# Patient Record
Sex: Male | Born: 1990 | Race: White | Hispanic: No | Marital: Single | Smoking: Never smoker
Health system: Southern US, Community
[De-identification: ages and names within clinical notes are randomized; demographics above are authoritative.]

## PROBLEM LIST (undated history)

## (undated) DIAGNOSIS — N200 Calculus of kidney: Secondary | ICD-10-CM

## (undated) HISTORY — PX: TESTICULAR PROSTHESIS INSERTION: SUR717

## (undated) HISTORY — PX: HERNIA REPAIR: SHX51

---
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
## 2010-08-21 ENCOUNTER — Emergency Department (HOSPITAL_COMMUNITY)
Admission: EM | Admit: 2010-08-21 | Discharge: 2010-08-21 | Disposition: A | Discharge status: Home / Self Care | Payer: 59 | Attending: Emergency Medicine | Admitting: Emergency Medicine

## 2010-08-21 ENCOUNTER — Emergency Department (HOSPITAL_COMMUNITY): Payer: 59

## 2010-08-21 DIAGNOSIS — R319 Hematuria, unspecified: Secondary | ICD-10-CM | POA: Insufficient documentation

## 2010-08-21 DIAGNOSIS — M549 Dorsalgia, unspecified: Secondary | ICD-10-CM | POA: Insufficient documentation

## 2010-08-21 DIAGNOSIS — R10819 Abdominal tenderness, unspecified site: Secondary | ICD-10-CM | POA: Insufficient documentation

## 2010-08-21 DIAGNOSIS — R109 Unspecified abdominal pain: Secondary | ICD-10-CM | POA: Insufficient documentation

## 2010-08-21 LAB — DIFFERENTIAL
Basophils Absolute: 0.1 10*3/uL (ref 0.0–0.1)
Basophils Relative: 1 % (ref 0–1)
Eosinophils Absolute: 0.7 10*3/uL (ref 0.0–0.7)
Eosinophils Relative: 7 % — ABNORMAL HIGH (ref 0–5)
Lymphocytes Relative: 40 % (ref 12–46)
Lymphs Abs: 3.8 10*3/uL (ref 0.7–4.0)
Monocytes Absolute: 0.7 10*3/uL (ref 0.1–1.0)
Monocytes Relative: 7 % (ref 3–12)
Neutro Abs: 4.2 10*3/uL (ref 1.7–7.7)
Neutrophils Relative %: 45 % (ref 43–77)

## 2010-08-21 LAB — BASIC METABOLIC PANEL
BUN: 12 mg/dL (ref 6–23)
CO2: 27 mEq/L (ref 19–32)
Calcium: 9.2 mg/dL (ref 8.4–10.5)
Chloride: 101 mEq/L (ref 96–112)
Creatinine, Ser: 0.95 mg/dL (ref 0.4–1.5)
GFR calc Af Amer: >60 mL/min (ref >60)
GFR calc non Af Amer: >60 mL/min (ref >60)
Glucose, Bld: 96 mg/dL (ref 70–99)
Potassium: 3.9 mEq/L (ref 3.5–5.1)
Sodium: 136 mEq/L (ref 135–145)

## 2010-08-21 LAB — URINE MICROSCOPIC-ADD ON

## 2010-08-21 LAB — CBC
HCT: 43.9 % (ref 39.0–52.0)
Hemoglobin: 15.1 g/dL (ref 13.0–17.0)
MCH: 30.8 pg (ref 26.0–34.0)
MCHC: 34.4 g/dL (ref 30.0–36.0)
MCV: 89.6 fL (ref 78.0–100.0)
Platelets: 244 10*3/uL (ref 150–400)
RBC: 4.9 MIL/uL (ref 4.22–5.81)
RDW: 13.3 % (ref 11.5–15.5)
WBC: 9.4 10*3/uL (ref 4.0–10.5)

## 2010-08-21 LAB — URINALYSIS, ROUTINE W REFLEX MICROSCOPIC
Bilirubin Urine: NEGATIVE
Ketones, ur: NEGATIVE mg/dL
Leukocytes, UA: NEGATIVE
Nitrite: NEGATIVE
Protein, ur: NEGATIVE mg/dL
Specific Gravity, Urine: 1.011 (ref 1.005–1.030)
Urine Glucose, Fasting: NEGATIVE mg/dL
Urobilinogen, UA: 0.2 mg/dL (ref 0.0–1.0)
pH: 7 (ref 5.0–8.0)

## 2010-11-22 NOTE — Op Note (Signed)
[name], [name]              ACCOUNT NO.:  [id number]   MEDICAL RECORD NO.:  [id number]          PATIENT TYPE:  AMB   LOCATION:  [facility]                         FACILITY:  [facility]   PHYSICIAN:  Heloise Purpura, MD      DATE OF BIRTH:  02-21-1991   DATE OF PROCEDURE:  06/27/2007  DATE OF DISCHARGE:                               OPERATIVE REPORT   SURGEON:  Heloise Purpura, M.D.   ASSISTANT:  Dr. Rachel Bo.   PROCEDURE:  1. Left inguinal exploration.  2. Diagnostic laparoscopy.  3. Insertion of left testicular prosthesis.   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  In the past, he  has had surgery involving a right hydrocele and is unsure of the details  regarding the disposition of his left testicle.  He presented to the  clinic and also desired a left testicular prosthesis.  He presents today  to the operating room electively for exploration and placement of left  testicular prosthesis.   DESCRIPTION OF PROCEDURE:  The patient was brought back into the  operating room.  He was placed supine on the operating table.  A  preoperative timeout was performed and he received preoperative  antibiotics.  An LMA anesthetic was initiated and then he was prepped  and draped in the usual sterile fashion.  The external ring was palpated  manually on the left side.  A subinguinal incision was made over this  along the skin folds.  The incision was approximately 4 cm in length.  We then used cautery to dissect through Scarpa's fascia and used [employer] retractors to expose the external ring.  We identified what  appeared to be an atrophic cord structure.  We were able to get a right  angle around it and dissecting through this we were unable to identify  any true testicular vessels due to either atrophy of vessels that were  present or scarring from the patient's prior surgery. No testis was  palpable within the  inguinal canal.  At this point the decision was made  to proceed with diagnostic laparoscopy to ensure that there were in fact  vessels into the inguinal canal which would indicate an absent left  testicle.   A small 1 cm incision was made through the lower aspect of the  umbilicus.  Under direct vision we dissected down through the rectus  fascia, through the rectus to the peritoneum.  A 5 mm laparoscopic port  was inserted under direct vision and positioning was verified with flow  patterns from the insufflator as well as with direct vision using the  camera port.  The camera was then used to identify vessels as well as a  vas deferens at the internal inguinal ring on the right side which was  the side of his testicle.  On the left side, we could see testicular  vessels going into the canal as well as what appeared to be an  atrophic/atretic vas deferens.  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 The  pneumoperitoneum was then expelled and the 5 mm trocar was removed.   We then proceeded with left testicular prosthesis placement.  Through  the inguinal incision, we found the dependent portion of the scrotum  after a space was created by using a sponge stick to spread and create a  scrotal pouch. We placed a 3-0 PDS suture in a figure-of-eight fashion  at this position on the inside of the scrotum through the dartos fascia  with care not to button hole the scrotal skin.  We then inflated a large  testicular prosthesis with 20 mL of sterile saline after all air had  been removed from the prosthesis.  We then attached the prosthesis tot  the preplaced PDS suture to secure it to the dependent portion of the  left hemiscrotum.  It was tied down.  Its position was adequate once the  prosthesis had  been redelivered into the scrotum.  At this point we used  copious antibiotic irrigation to irrigate all wounds, placed local  anesthetic with 0.5% marcaine, and closed the wounds.  The umbilical  wound was closed in a figure-of-eight fashion closing the fascia with a  0-vicryl and the skin was reapproximated with 4-0 Monocryl subcuticular  sutures.  The inguinal incision was closed in layers with 3-0 Vicryl to  reaproximate Scarpa's fascia and 4-0 Monocryl subcuticular closure for  the skin.  At this point we ended the procedure. Dermabond was placed  over each incision site and a fluff scrotal compressive dressing was  also placed.  Please note Dr. Laverle Patter was the primary surgeon and was  present throughout the entirety of this case.   ESTIMATED BLOOD LOSS:  Minimal.   URINE OUTPUT:  Not recorded.   DRAINS:  None.   SPECIMENS:  None.   DISPOSITION:  The patient will go to PACU for further care.     ______________________________  Dr. Merilynn Finland, MD  Electronically Signed    Hadley Pen  D:  06/27/2007  T:  06/27/2007  Job:  [id number]

## 2010-12-09 ENCOUNTER — Other Ambulatory Visit: Payer: Self-pay | Admitting: Internal Medicine

## 2010-12-09 DIAGNOSIS — N63 Unspecified lump in unspecified breast: Secondary | ICD-10-CM

## 2010-12-15 ENCOUNTER — Other Ambulatory Visit: Payer: Self-pay | Admitting: Internal Medicine

## 2010-12-15 ENCOUNTER — Ambulatory Visit
Admission: RE | Admit: 2010-12-15 | Discharge: 2010-12-15 | Disposition: A | Discharge status: Home / Self Care | Payer: 59 | Source: Ambulatory Visit | Attending: Internal Medicine | Admitting: Internal Medicine

## 2010-12-15 DIAGNOSIS — N63 Unspecified lump in unspecified breast: Secondary | ICD-10-CM

## 2010-12-16 ENCOUNTER — Other Ambulatory Visit: Payer: Self-pay | Admitting: Internal Medicine

## 2010-12-16 ENCOUNTER — Ambulatory Visit
Admission: RE | Admit: 2010-12-16 | Discharge: 2010-12-16 | Disposition: A | Discharge status: Home / Self Care | Payer: 59 | Source: Ambulatory Visit | Attending: Internal Medicine | Admitting: Internal Medicine

## 2010-12-16 ENCOUNTER — Ambulatory Visit: Admission: RE | Admit: 2010-12-16 | Payer: 59 | Source: Ambulatory Visit

## 2010-12-16 ENCOUNTER — Ambulatory Visit
Admission: RE | Admit: 2010-12-16 | Discharge: 2010-12-16 | Disposition: A | Discharge status: Home / Self Care | Payer: 59 | Source: Ambulatory Visit

## 2010-12-16 DIAGNOSIS — N63 Unspecified lump in unspecified breast: Secondary | ICD-10-CM

## 2011-04-14 LAB — POCT HEMOGLOBIN-HEMACUE
Hemoglobin: 16.2 — ABNORMAL HIGH
Operator id: 268271

## 2014-08-29 ENCOUNTER — Encounter (HOSPITAL_COMMUNITY): Payer: Self-pay | Admitting: Family Medicine

## 2014-08-29 ENCOUNTER — Emergency Department (HOSPITAL_COMMUNITY)
Admission: EM | Admit: 2014-08-29 | Discharge: 2014-08-29 | Disposition: A | Discharge status: Home / Self Care | Payer: Medicare Other | Attending: Emergency Medicine | Admitting: Emergency Medicine

## 2014-08-29 ENCOUNTER — Emergency Department (HOSPITAL_COMMUNITY): Payer: Medicare Other

## 2014-08-29 DIAGNOSIS — M545 Low back pain: Secondary | ICD-10-CM | POA: Insufficient documentation

## 2014-08-29 DIAGNOSIS — R109 Unspecified abdominal pain: Secondary | ICD-10-CM

## 2014-08-29 DIAGNOSIS — R112 Nausea with vomiting, unspecified: Secondary | ICD-10-CM | POA: Diagnosis not present

## 2014-08-29 DIAGNOSIS — Z87442 Personal history of urinary calculi: Secondary | ICD-10-CM | POA: Diagnosis not present

## 2014-08-29 DIAGNOSIS — R1084 Generalized abdominal pain: Secondary | ICD-10-CM | POA: Diagnosis not present

## 2014-08-29 HISTORY — DX: Calculus of kidney: N20.0

## 2014-08-29 LAB — COMPREHENSIVE METABOLIC PANEL
ALT: 18 U/L (ref 0–53)
AST: 22 U/L (ref 0–37)
Albumin: 5 g/dL (ref 3.5–5.2)
Alkaline Phosphatase: 74 U/L (ref 39–117)
Anion gap: 14 (ref 5–15)
BUN: 17 mg/dL (ref 6–23)
CO2: 20 mmol/L (ref 19–32)
Calcium: 9.8 mg/dL (ref 8.4–10.5)
Chloride: 104 mmol/L (ref 96–112)
Creatinine, Ser: 1.12 mg/dL (ref 0.50–1.35)
GFR calc Af Amer: >90 mL/min (ref >90)
GFR calc non Af Amer: >90 mL/min (ref >90)
Glucose, Bld: 104 mg/dL — ABNORMAL HIGH (ref 70–99)
Potassium: 4.4 mmol/L (ref 3.5–5.1)
Sodium: 138 mmol/L (ref 135–145)
Total Bilirubin: 1.3 mg/dL — ABNORMAL HIGH (ref 0.3–1.2)
Total Protein: 8.9 g/dL — ABNORMAL HIGH (ref 6.0–8.3)

## 2014-08-29 LAB — CBC WITH DIFFERENTIAL/PLATELET
Basophils Absolute: 0 10*3/uL (ref 0.0–0.1)
Basophils Relative: 0 % (ref 0–1)
Eosinophils Absolute: 0.2 10*3/uL (ref 0.0–0.7)
Eosinophils Relative: 2 % (ref 0–5)
HCT: 48.2 % (ref 39.0–52.0)
Hemoglobin: 16.9 g/dL (ref 13.0–17.0)
Lymphocytes Relative: 9 % — ABNORMAL LOW (ref 12–46)
Lymphs Abs: 1 10*3/uL (ref 0.7–4.0)
MCH: 30.4 pg (ref 26.0–34.0)
MCHC: 35.1 g/dL (ref 30.0–36.0)
MCV: 86.7 fL (ref 78.0–100.0)
Monocytes Absolute: 0.7 10*3/uL (ref 0.1–1.0)
Monocytes Relative: 7 % (ref 3–12)
Neutro Abs: 9.5 10*3/uL — ABNORMAL HIGH (ref 1.7–7.7)
Neutrophils Relative %: 82 % — ABNORMAL HIGH (ref 43–77)
Platelets: 184 10*3/uL (ref 150–400)
RBC: 5.56 MIL/uL (ref 4.22–5.81)
RDW: 12.3 % (ref 11.5–15.5)
WBC: 11.4 10*3/uL — ABNORMAL HIGH (ref 4.0–10.5)

## 2014-08-29 LAB — LIPASE, BLOOD: Lipase: 26 U/L (ref 11–59)

## 2014-08-29 MED ORDER — PROMETHAZINE HCL 25 MG/ML IJ SOLN
12.5000 mg | Freq: Once | INTRAMUSCULAR | Status: AC
  Administered 2014-08-29: 12.5 mg via INTRAVENOUS
  Filled 2014-08-29: qty 1

## 2014-08-29 MED ORDER — METHOCARBAMOL 500 MG PO TABS
500.0000 mg | ORAL_TABLET | Freq: Once | ORAL | Status: AC
  Administered 2014-08-29: 500 mg via ORAL
  Filled 2014-08-29: qty 1

## 2014-08-29 MED ORDER — MORPHINE SULFATE 4 MG/ML IJ SOLN
4.0000 mg | INTRAMUSCULAR | Status: AC | PRN
  Administered 2014-08-29 (×2): 4 mg via INTRAVENOUS
  Filled 2014-08-29: qty 1

## 2014-08-29 MED ORDER — METHOCARBAMOL 500 MG PO TABS: 1000.0000 mg | ORAL_TABLET | Freq: Four times a day (QID) | ORAL | Status: AC | PRN

## 2014-08-29 MED ORDER — PROMETHAZINE HCL 25 MG PO TABS: 25.0000 mg | ORAL_TABLET | Freq: Four times a day (QID) | ORAL | Status: AC | PRN

## 2014-08-29 MED ORDER — PROMETHAZINE HCL 25 MG/ML IJ SOLN
25.0000 mg | Freq: Once | INTRAMUSCULAR | Status: DC
  Filled 2014-08-29: qty 1

## 2014-08-29 MED ORDER — ONDANSETRON 4 MG PO TBDP: 8.0000 mg | ORAL_TABLET | Freq: Once | ORAL | Status: DC

## 2014-08-29 MED ORDER — MORPHINE SULFATE 4 MG/ML IJ SOLN
4.0000 mg | Freq: Once | INTRAMUSCULAR | Status: DC
  Filled 2014-08-29: qty 1

## 2014-08-29 MED ORDER — HYDROCODONE-ACETAMINOPHEN 5-325 MG PO TABS
2.0000 | ORAL_TABLET | Freq: Once | ORAL | Status: AC
  Administered 2014-08-29: 2 via ORAL
  Filled 2014-08-29: qty 2

## 2014-08-29 MED ORDER — IOHEXOL 300 MG/ML  SOLN: 25.0000 mL | Freq: Once | INTRAMUSCULAR | Status: DC | PRN

## 2014-08-29 MED ORDER — HYDROCODONE-ACETAMINOPHEN 5-325 MG PO TABS: ORAL_TABLET | ORAL | Status: AC

## 2014-08-29 NOTE — ED Notes (Signed)
Patient transported to CT 

## 2014-08-29 NOTE — ED Notes (Signed)
Pt reports pain is increasing, mother of patient states she is uncomfortable taking patient home in pain.  Dr Gwendolyn Grantwalden notified, at bedside talking to patient and family

## 2014-08-29 NOTE — ED Notes (Addendum)
Pt here complaining of abd pain, back pain, and SOB. Pt hyperventilating and coached to slow down breathing. Pt sts hands are numb and tingling.

## 2014-08-29 NOTE — ED Provider Notes (Signed)
CSN: [id number]     Arrival date & time 08/29/14  1251 History   First MD Initiated Contact with Patient 08/29/14 1313     Chief Complaint  Patient presents with  . Back Pain  . Nausea  . Abdominal Pain     HPI Pt was seen at 1320.   Per pt, c/o gradual onset and persistence of constant generalized abd "pain" since this morning.  Has been associated with multiple intermittent episodes of N/V. Has been associated with low back pain, hyperventilating, and anxiety.  Denies diarrhea, no fevers, no dysuria/hematuria, no testicular pain/swelling, no rash, no CP/SOB, no black or blood in stools or emesis.      Past Medical History  Diagnosis Date  . Kidney stones    Past Surgical History  Procedure Laterality Date  . Hernia repair    . Testicular prosthesis insertion      as a child     History  Substance Use Topics  . Smoking status: Never Smoker   . Smokeless tobacco: Not on file  . Alcohol Use: No    Review of Systems ROS: Statement: All systems negative except as marked or noted in the HPI; Constitutional: Negative for fever and chills. ; ; Eyes: Negative for eye pain, redness and discharge. ; ; ENMT: Negative for ear pain, hoarseness, nasal congestion, sinus pressure and sore throat. ; ; Cardiovascular: Negative for chest pain, palpitations, diaphoresis, dyspnea and peripheral edema. ; ; Respiratory: Negative for cough, wheezing and stridor. ; ; Gastrointestinal: +N/V, abd pain. Negative for diarrhea, blood in stool, hematemesis, jaundice and rectal bleeding. . ; ; Genitourinary: Negative for dysuria, flank pain and hematuria. ; ; Musculoskeletal: +LBP. Negative for neck pain. Negative for swelling and trauma.; ; Skin: Negative for pruritus, rash, abrasions, blisters, bruising and skin lesion.; ; Neuro: Negative for headache, lightheadedness and neck stiffness. Negative for weakness, altered level of consciousness , altered mental status, extremity weakness, paresthesias, involuntary  movement, seizure and syncope.; Psych:  +anxiety. No SI, no SA, no HI, no hallucinations.     Allergies  Cefzil  Home Medications   Prior to Admission medications   Not on File   BP 117/67 mmHg  Pulse 82  Temp(Src) 98.6 F (37 C)  Resp 30  Wt 165 lb (74.844 kg)  SpO2 100% Physical Exam  1325: Physical examination:  Nursing notes reviewed; Vital signs and O2 SAT reviewed;  Constitutional: Well developed, Well nourished, Well hydrated, In no acute distress. Anxious, hyperventilating.; Head:  Normocephalic, atraumatic; Eyes: EOMI, PERRL, No scleral icterus; ENMT: Mouth and pharynx normal, Mucous membranes moist; Neck: Supple, Full range of motion, No lymphadenopathy; Cardiovascular: Regular rate and rhythm, No murmur, rub, or gallop; Respiratory: Breath sounds clear & equal bilaterally, No rales, rhonchi, wheezes. +hyperventilating. Speaking full sentences with ease, Normal respiratory effort/excursion; Chest: Nontender, Movement normal; Abdomen: Soft, +mild diffuse tenderness < RLQ tenderness to palp. No rebound or guarding. Nondistended, Normal bowel sounds; Genitourinary: No CVA tenderness; Extremities: Pulses normal, No tenderness, No edema, No calf edema or asymmetry.; Neuro: AA&Ox3, Major CN grossly intact.  Speech clear. No gross focal motor or sensory deficits in extremities.; Skin: Color normal, Warm, Dry.   ED Course  Procedures      EKG Interpretation None      MDM  MDM Reviewed: previous chart, nursing note and vitals Reviewed previous: labs Interpretation: labs and CT scan      Results for orders placed or performed during the hospital encounter of 08/29/14  CBC with Differential  Result Value Ref Range   WBC 11.4 (H) 4.0 - 10.5 K/uL   RBC 5.56 4.22 - 5.81 MIL/uL   Hemoglobin 16.9 13.0 - 17.0 g/dL   HCT 16.1 09.6 - 04.5 %   MCV 86.7 78.0 - 100.0 fL   MCH 30.4 26.0 - 34.0 pg   MCHC 35.1 30.0 - 36.0 g/dL   RDW 40.9 81.1 - 91.4 %   Platelets 184 150 - 400  K/uL   Neutrophils Relative % 82 (H) 43 - 77 %   Neutro Abs 9.5 (H) 1.7 - 7.7 K/uL   Lymphocytes Relative 9 (L) 12 - 46 %   Lymphs Abs 1.0 0.7 - 4.0 K/uL   Monocytes Relative 7 3 - 12 %   Monocytes Absolute 0.7 0.1 - 1.0 K/uL   Eosinophils Relative 2 0 - 5 %   Eosinophils Absolute 0.2 0.0 - 0.7 K/uL   Basophils Relative 0 0 - 1 %   Basophils Absolute 0.0 0.0 - 0.1 K/uL  Comprehensive metabolic panel  Result Value Ref Range   Sodium 138 135 - 145 mmol/L   Potassium 4.4 3.5 - 5.1 mmol/L   Chloride 104 96 - 112 mmol/L   CO2 20 19 - 32 mmol/L   Glucose, Bld 104 (H) 70 - 99 mg/dL   BUN 17 6 - 23 mg/dL   Creatinine, Ser 7.82 0.50 - 1.35 mg/dL   Calcium 9.8 8.4 - 95.6 mg/dL   Total Protein 8.9 (H) 6.0 - 8.3 g/dL   Albumin 5.0 3.5 - 5.2 g/dL   AST 22 0 - 37 U/L   ALT 18 0 - 53 U/L   Alkaline Phosphatase 74 39 - 117 U/L   Total Bilirubin 1.3 (H) 0.3 - 1.2 mg/dL   GFR calc non Af Amer >90 >90 mL/min   GFR calc Af Amer >90 >90 mL/min   Anion gap 14 5 - 15  Lipase, blood  Result Value Ref Range   Lipase 26 11 - 59 U/L   Ct Abdomen Pelvis Wo Contrast 08/29/2014   CLINICAL DATA:  Back pain. Groin pain. Right lower quadrant abdominal pain on palpation. History of nephrolithiasis and hernia repair. Testicle implant.  EXAM: CT ABDOMEN AND PELVIS WITHOUT CONTRAST  TECHNIQUE: Multidetector CT imaging of the abdomen and pelvis was performed following the standard protocol without IV contrast.  COMPARISON:  08/21/2010.  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 Tiny upper pole right renal calculus. No bladder, ureteral or left renal calculi. No hydronephrosis.  Unremarkable non contrasted appearance of the liver, spleen, pancreas, gallbladder, adrenal glands and prostate gland. Clear lung bases. Small Schmorl's nodes in the lower thoracic spine.  IMPRESSION: 1. Tiny, nonobstructing upper pole right renal calculus. 2. No acute abnormality.   Electronically Signed   By: Beckie Salts M.D.   On: 08/29/2014 14:50    1615:  Pt has tol PO well while in the ED without N/V.  No stooling while in the ED.  Pt no longer hyperventilating. VSS. Feels better and wants to go home now. Dx and testing d/w pt and family.  Questions answered.  Verb understanding, agreeable to d/c home with outpt f/u.    Samuel Jester, DO 09/01/14 [phone]

## 2014-08-29 NOTE — Discharge Instructions (Signed)
°Emergency Department Resource Guide °1) Find a Doctor and Pay Out of Pocket °Although you won't have to find out who is covered by your insurance plan, it is a good idea to ask around and get recommendations. You will then need to call the office and see if the doctor you have chosen will accept you as a new patient and what types of options they offer for patients who are self-pay. Some doctors offer discounts or will set up payment plans for their patients who do not have insurance, but you will need to ask so you aren't surprised when you get to your appointment. ° °2) Contact Your Local Health Department °Not all health departments have doctors that can see patients for sick visits, but many do, so it is worth a call to see if yours does. If you don't know where your local health department is, you can check in your phone book. The CDC also has a tool to help you locate your state's health department, and many state websites also have listings of all of their local health departments. ° °3) Find a Walk-in Clinic °If your illness is not likely to be very severe or complicated, you may want to try a walk in clinic. These are popping up all over the country in pharmacies, drugstores, and shopping centers. They're usually staffed by nurse practitioners or physician assistants that have been trained to treat common illnesses and complaints. They're usually fairly quick and inexpensive. However, if you have serious medical issues or chronic medical problems, these are probably not your best option. ° °No Primary Care Doctor: °- Call [facility] at  832-8000 - they can help you locate a primary care doctor that  accepts your insurance, provides certain services, etc. °- Physician Referral Service- 1-800-533-3463 ° °Chronic Pain Problems: °Organization         Address  Phone   Notes  °[facility]  (336) 297-2271 Patients need to be referred by their primary care doctor.  ° °Medication  Assistance: °Organization         Address  Phone   Notes  °[place] [facility] [address] °[place], [place] [address] (336) 641-8030 --Must be a resident of [place] °-- Must have NO insurance coverage whatsoever (no Medicaid/ Medicare, etc.) °-- The pt. MUST have a primary care doctor that directs their care regularly and follows them in the community °  °[facility]  (866) 331-1348   °[employer]  (888) 892-1162   ° °Agencies that provide inexpensive medical care: °Organization         Address  Phone   Notes  °[facility]  (336) 832-8035   °[facility]    (336) 832-7272   °[facility] [address] °[place], [place] [address] (336) 832-4777   °[facility] [address], °[place] (336) 271-4999   °[facility]    (336) 373-0678   °[facility]    (336) 272-1050   °[facility] ° [address], [place] Phone:  (336) 832-4444, Fax:  (336) 832-4440 Hours of Operation:  9 am - 6 pm, M-F.  Also accepts Medicaid/Medicare and self-pay.  °[facility] ° [address], [place] Phone: (336) [phone], Fax: (336) [phone]. Hours of Operation:  8:30 am - 5:30 pm, M-F.  Also accepts Medicaid and self-pay.  °[facility] [place] [address], [place] Phone: (336) 878-6027   °[facility] [address], [place], [place] (336)723-1848, Ext. 123 Mondays & Thursdays: 7-9 AM.  First 15 patients are seen on a first come, first serve basis. °  ° °Medicaid-accepting [place] Providers: ° °Organization         Address  Phone   Notes  °[facility] [address], [place] (336) 641-2100 Also accepts self-pay patients.  °[facility] [address], [place] ° (336) 856-9996   °[facility] [address], [place]  (336) 288-8857   °[facility] [address] [place] [address], [place] (336) 299-7000   °Veita Bland [address], [place]  ° (336) 373-1557 Only accepts [place] Access Medicaid patients after they have their name applied to their card.  ° °Self-Pay (no insurance) in [place]: ° °Organization         Address  Phone   Notes  °Sickle Cell Patients, [facility] [address], [place] (336) 832-1970   °[facility] [address], [place] (336) 832-4400   °[facility] ° [address], [place] (336) 992-4800   °[facility]/Dr. Osei-Bonsu ° [address] [place] [address], [place] or [address], [place] (336) 841-8500 Phone number for both [place] and [place] locations is the same.  °[facility] [address], [place] (336) 299-0000   °[facility] [address] [place] [address], [place] or [address] (336) 852-7530 °(336) 878-2260   °[facility] [address], [place] (336) 350-1642, phone; (336) 294-5005, fax Sees patients 1st and 3rd Saturday of every month.  Must not qualify for public or private insurance (i.e. Medicaid, Medicare, [place] Health Choice, Veterans' Benefits) • Household income should be no more than 200% of the poverty level •The clinic cannot treat you if you are pregnant or think you are pregnant • Sexually transmitted diseases are not treated at the clinic.  ° ° °Dental Care: °Organization         Address  Phone  Notes  °[place] [facility] [address], [place] (336) 641-6152 Accepts children up to age 21 who are enrolled in Medicaid or [place] Health Choice; pregnant women with a Medicaid card; and children who have applied for Medicaid or [place] Health Choice, but were declined, whose parents can pay a reduced fee at time of service.  °[place]  [facility] [place]  [address], [place] (336) 641-7733 Accepts children up to age 21 who are enrolled in Medicaid or [place] Health Choice; pregnant women with a Medicaid card; and children who have applied for Medicaid or [place] Health Choice, but were declined, whose parents can pay a reduced fee at time of service.  °[facility] ° [address], [place] (336) 641-4533 Patients are seen by appointment only. Walk-ins are not accepted. [facility] will see patients 18 years of age and older. °Monday - Tuesday (8am-5pm) °Most Wednesdays (8:30-5pm) °$30 per visit, cash only  °[facility] ° [address], [place] (336) 641-4533 Patients are seen by appointment only. Walk-ins are not accepted. [facility] will see patients 18 years of age and older. °One   Wednesday Evening (Monthly: Volunteer Based).  $30 per visit, cash only  °[facility]  (919) [phone] for adults; Children under age 4, call Graduate Pediatric Dentistry at ([phone]) [phone]. Children aged 4-14, please call ([phone]) [phone] to request a pediatric application. ° Dental services are provided in all areas of dental care including fillings, crowns and bridges, complete and partial dentures, implants, gum treatment, root canals, and extractions. Preventive care is also provided. Treatment is provided to both adults and children. °Patients are selected via a lottery and there is often a waiting list. °  °[facility] [address], °[place] ° (336) 763-8833 [email or web address] °  °[facility] [address], [place], [place] (336)723-1848, Ext. 123 Second and Fourth Thursday of each month, opens at 6:30 AM; Clinic ends at 9 AM.  Patients are seen on a first-come first-served basis, and a limited number are seen during each clinic.  ° °[facility] ° [address], [place], [place] (336) 723-7904    Eligibility Requirements °You must have lived in [place], [place], or [place] counties for at least the last three months. °  You cannot be eligible for state or federal sponsored healthcare insurance, including Veterans Administration, Medicaid, or Medicare. °  You generally cannot be eligible for healthcare insurance through your employer.  °  How to apply: °Eligibility screenings are held every Tuesday and Wednesday afternoon from 1:00 pm until 4:00 pm. You do not need an appointment for the interview!  °[facility] [address], [place], [place] 336-631-2330   °[facility]  336-342-8273   °[facility]  336-703-3100   °[facility]  336-570-6415   ° °Behavioral Health Resources in the Community: °Intensive Outpatient Programs °Organization         Address  Phone  Notes  °[facility] [address], [place], [place] 336-878-6098   °[facility] [address], [place], [place] 336-832-9800   °[facility] [address], [place], [place] ° 336-882-2125   °[facility] [address],  °[place], [place] 1-800-853-5163 or 336-641-4981   °Substance Abuse Resources °Organization         Address  Phone  Notes  °[facility]  336-882-2125   °[facility]  336-784-9470   °[facility]  336-285-9073   °[facility]  336-845-3988   °Residential & Outpatient Substance Abuse Program  1-800-659-3381   °Psychological Services °Organization         Address  Phone  Notes  °[facility]  336- 832-9600   °[facility]  336- 378-7881   °[facility] [address], [place] 1-800-853-5163 or 336-641-4981   ° °Mobile Crisis Teams °Organization         Address  Phone  Notes  °[facility], Mobile Crisis Care Unit  1-877-626-1772   °Assertive °[facility] ° [address]  [place], [place] 336-834-9664   °Sharon DeEsch [address] °[place] 336-554-5454   ° °Self-Help/Support Groups °Organization         Address  Phone             Notes  °[facility]  - variety of support groups  336- 373-1402 Call for more information  °Narcotics Anonymous (NA), [facility] [address], °[place]  2 meetings at this location  ° °  Residential Treatment Programs Organization         Address  Phone  Notes  [facility] [address],    [place]  [phone]   [facility]  [address], [place] [phone], [place], [place] [phone]   [facility] [address], [place] [phone] Admissions: 8am-3pm M-F  [facility] [address]    [place], [place] [phone]   [facility] [address], [place], [place] [phone]   [facility] [address]  [place], [place] [phone]   [facility] Intensive Outpatient [address], [place], [place] [phone]   [facility] ([facility] [address]  [place], [place] [phone] or [phone]   [facility] ([facility]) [address] [place], [place] [phone] Accepts Medicaid  [facility] [address]  [place] [phone] Substance Abuse/Addiction Treatment   [facility] Organization         Address  Phone  Notes  [facility]  803-368-7212(888) (989) 201-8095   Angie FavaJulie Brannon, PhD [address] [place], [place]   [phone](336) (226)[phone] or [phone](336) [phone]   [facility]   [address] [place], [place] [phone](336) [phone]   [facility] [address], [place], [place] [phone](336) [phone] Insurance/Medicaid/sponsorship through [facility] [address]                                    [place], [place] [phone](336) [phone] Therapy/tele-psych/case    [facility] [address]   [place], [place] [phone](336) [phone]    Dr. Lolly MustacheArfeen  [phone](336) [phone]   [facility]  [employer] [facility] 1) [address], [place] 2) [address], [place] 3)  [address], [place] [phone](336) [phone](336) [phone]  [phone](336) [phone]   [facility] Child Abuse Hotline 660-582-9645(336) (519)[phone] or 3313076791(336) [phone] (After Hours)      Take the prescriptions as directed.  Increase your fluid intake (ie:  Gatoraide) for the next few days, as discussed.  Eat a bland diet and advance to your regular diet slowly as you can tolerate it.  Apply moist heat or ice to the area(s) of discomfort, for 15 minutes at a time, several times per day for the next few days.  Do not fall asleep on a heating or ice pack. Call your regular medical doctor Monday to schedule a follow up appointment this week.  Return to the Emergency Department immediately if not improving (or even worsening) despite taking the medicines as prescribed, any black or bloody stool or vomit, if you develop a fever over "101," or for any other concerns.

## 2016-06-13 IMAGING — CT CT ABD-PELV W/O CM
2 of 4 series · 16 of 46 positions shown, 18 images · non-contrast
Comparison: 08/21/2010.

CLINICAL DATA: Back pain. Groin pain. Right lower quadrant
abdominal pain on palpation. History of nephrolithiasis and hernia
repair. Testicle implant.

EXAM:
CT ABDOMEN AND PELVIS WITHOUT CONTRAST
TECHNIQUE: Multidetector CT imaging of the abdomen and pelvis was performed
following the standard protocol without IV contrast.

[Series 2: stone study 5.0 i30f 1 · axial · 0.69mm/px · z∈[+562,+1012]mm · 13 of 98 slices shown, 15 images]
[im 4/98  soft-tissue]
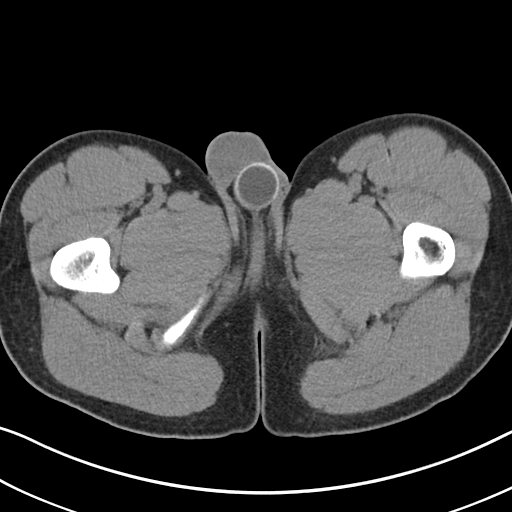
[im 4/98  bone]
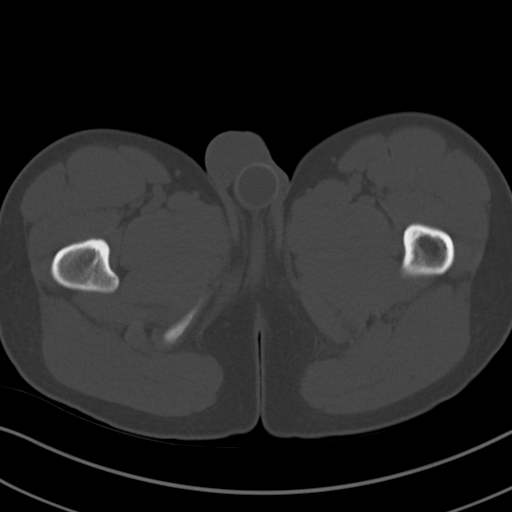
[im 12/98  soft-tissue]
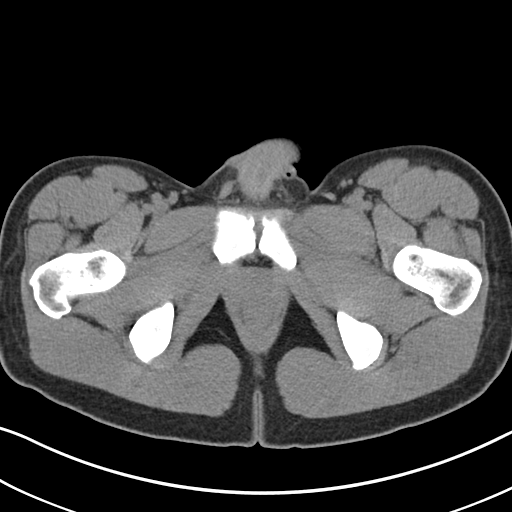
[im 20/98  soft-tissue]
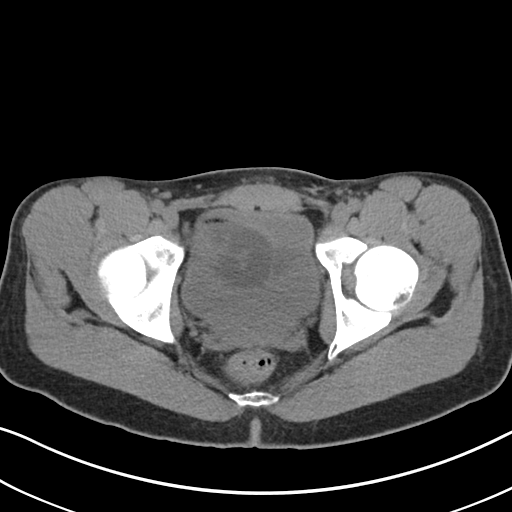
[im 28/98  soft-tissue]
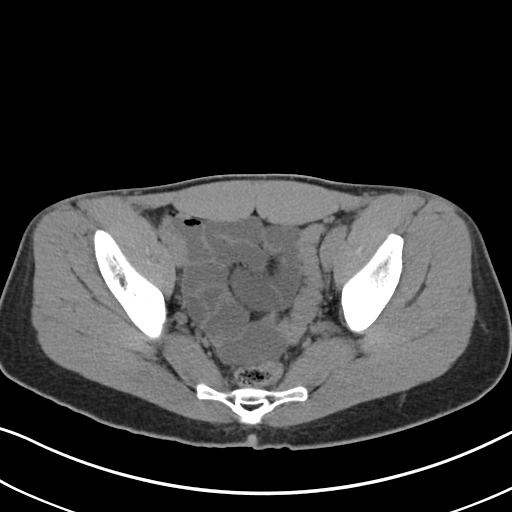
[im 35/98  soft-tissue]
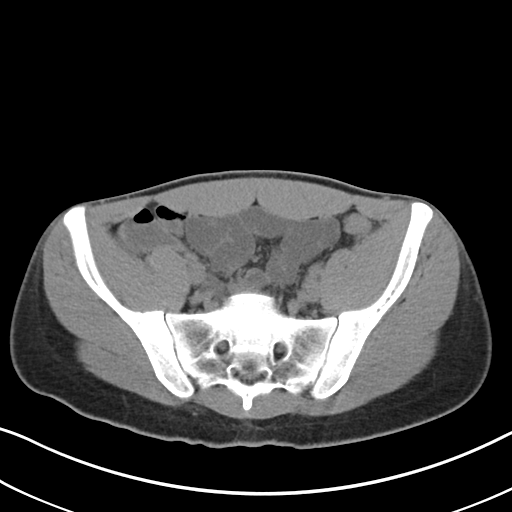
[im 43/98  soft-tissue]
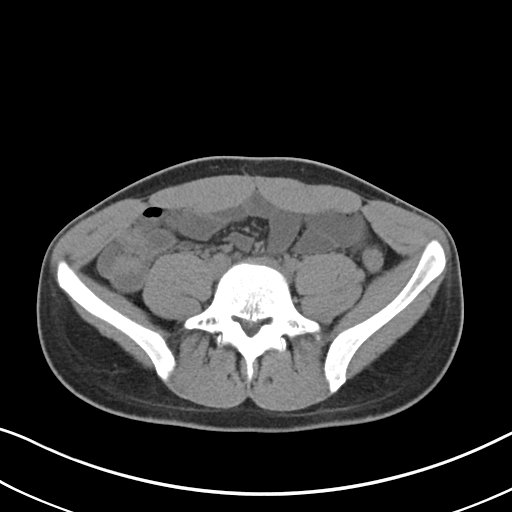
[im 51/98  soft-tissue]
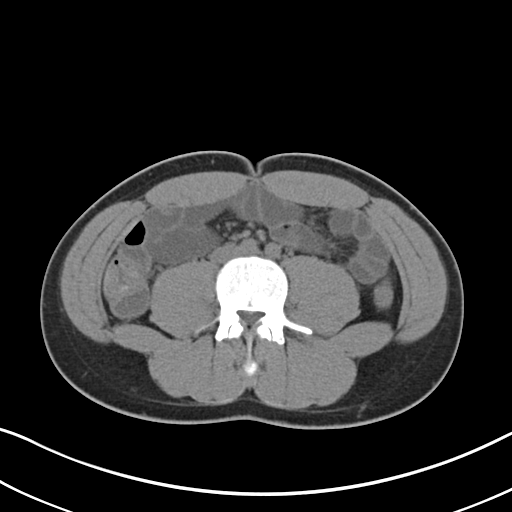
[im 55/98  soft-tissue]
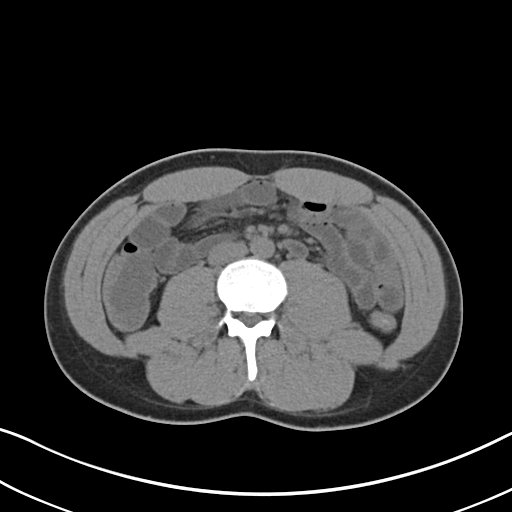
[im 63/98  soft-tissue]
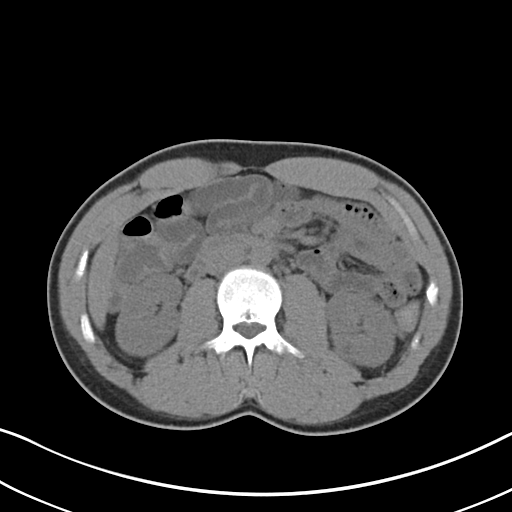
[im 63/98  bone]
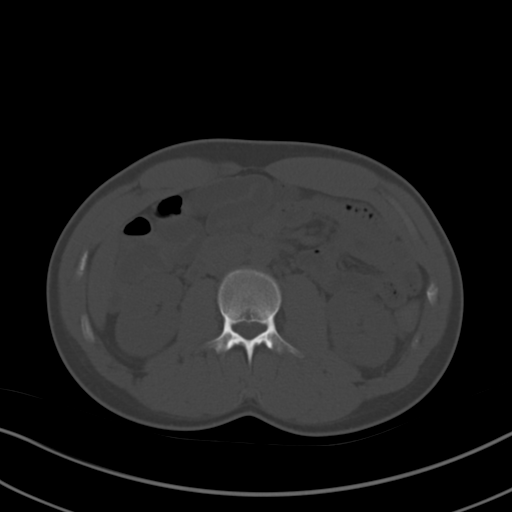
[im 70/98  soft-tissue]
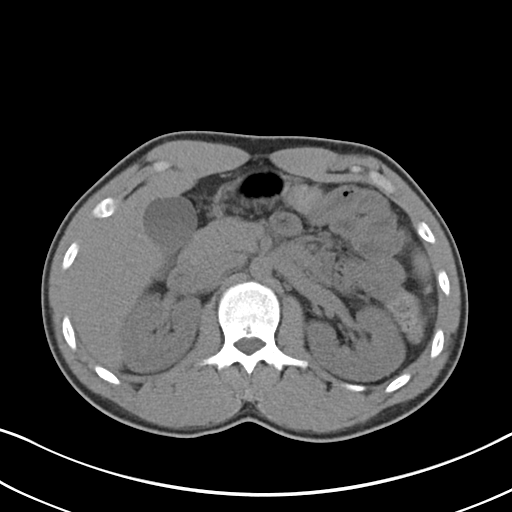
[im 78/98  soft-tissue]
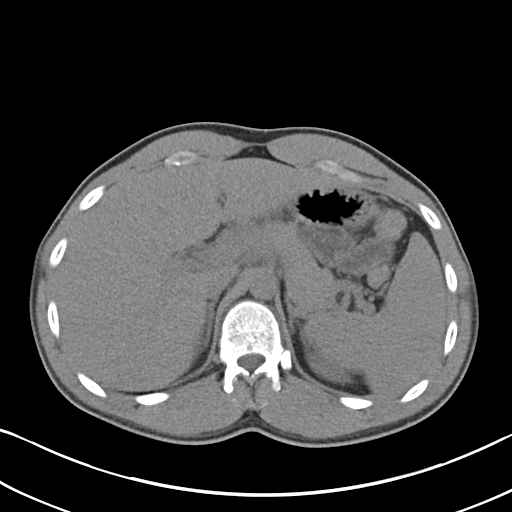
[im 86/98  soft-tissue]
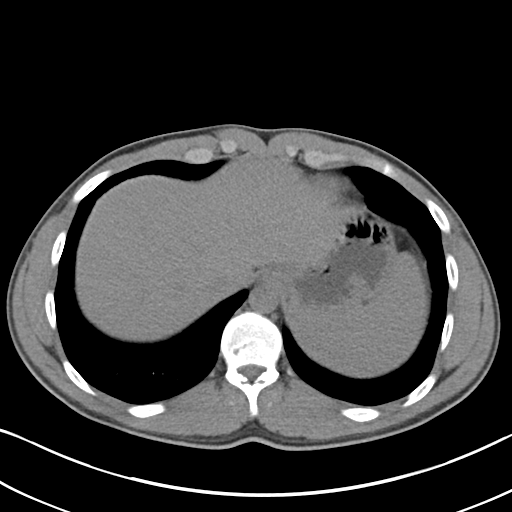
[im 94/98  soft-tissue]
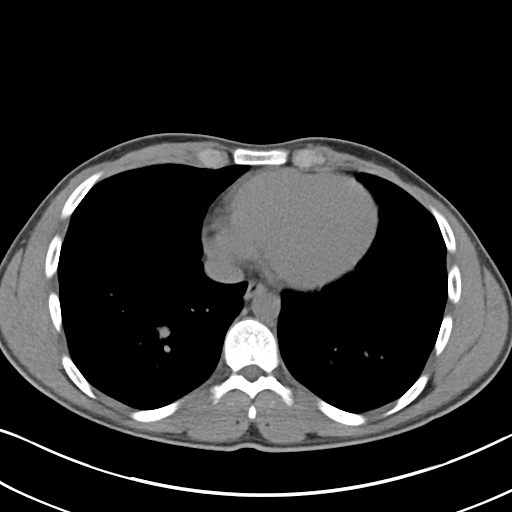

[Series 5: coronal soft tissue · coronal · 0.69mm/px · 3 of 73 slices shown]
[im 25/73  soft-tissue]
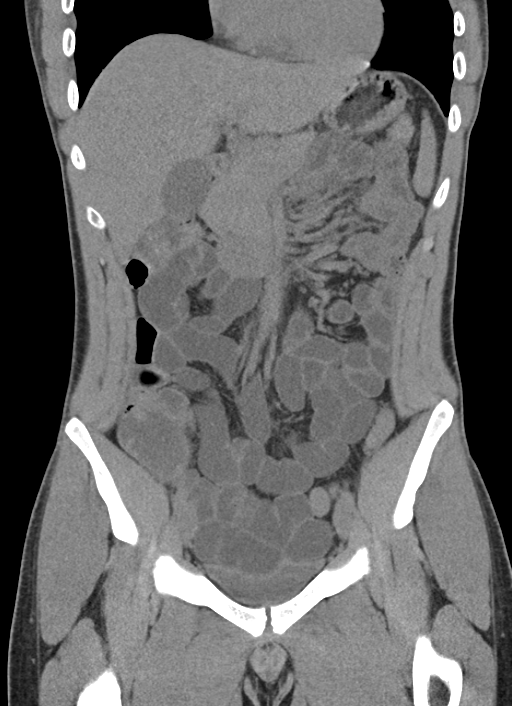
[im 33/73  soft-tissue]
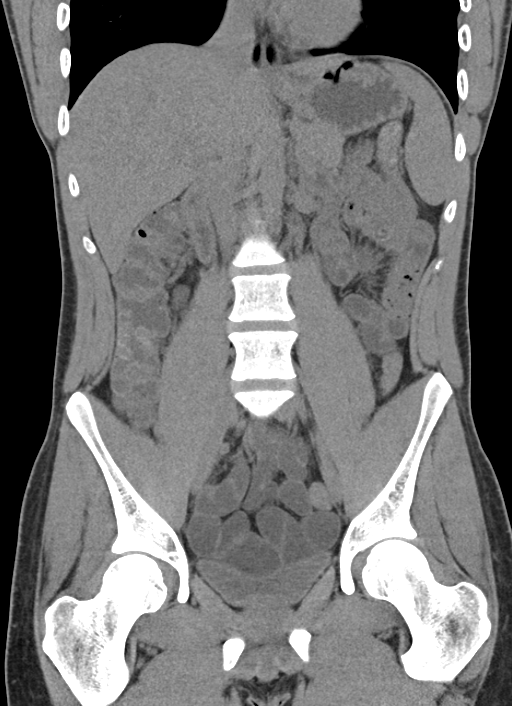
[im 41/73  soft-tissue]
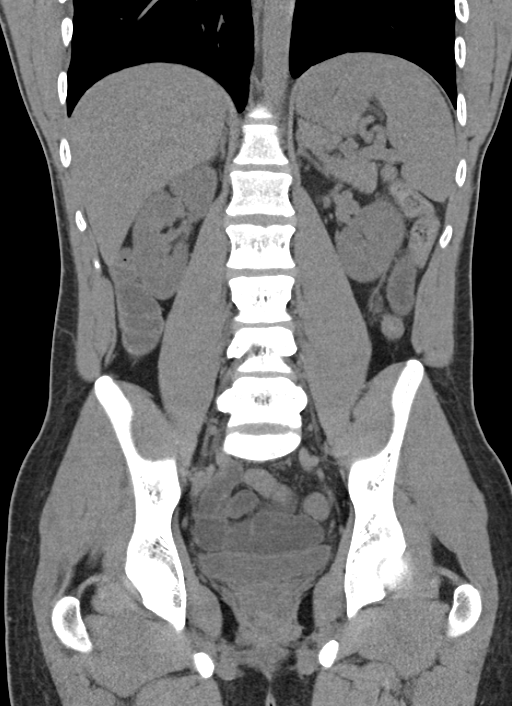

[16 of 46 positions shown; findings below may reference images not displayed]

FINDINGS: A left testicular prosthesis this is again demonstrated. Tiny upper
pole right renal calculus. No bladder, ureteral or left renal
calculi. No hydronephrosis.

Unremarkable non contrasted appearance of the liver, spleen,
pancreas, gallbladder, adrenal glands and prostate gland. Clear lung
bases. Small Schmorl's nodes in the lower thoracic spine.
IMPRESSION: 1. Tiny, nonobstructing upper pole right renal calculus.
2. No acute abnormality.

## 2017-02-20 DIAGNOSIS — Z0000 Encounter for general adult medical examination without abnormal findings: Secondary | ICD-10-CM | POA: Diagnosis not present

## 2017-02-27 DIAGNOSIS — Z6822 Body mass index (BMI) 22.0-22.9, adult: Secondary | ICD-10-CM | POA: Diagnosis not present

## 2017-02-27 DIAGNOSIS — M25512 Pain in left shoulder: Secondary | ICD-10-CM | POA: Diagnosis not present

## 2017-02-27 DIAGNOSIS — Z0000 Encounter for general adult medical examination without abnormal findings: Secondary | ICD-10-CM | POA: Diagnosis not present

## 2018-02-22 DIAGNOSIS — Z0000 Encounter for general adult medical examination without abnormal findings: Secondary | ICD-10-CM | POA: Diagnosis not present

## 2018-02-27 DIAGNOSIS — Z0000 Encounter for general adult medical examination without abnormal findings: Secondary | ICD-10-CM | POA: Diagnosis not present

## 2018-02-27 DIAGNOSIS — Z6823 Body mass index (BMI) 23.0-23.9, adult: Secondary | ICD-10-CM | POA: Diagnosis not present

## 2018-04-01 DIAGNOSIS — Z23 Encounter for immunization: Secondary | ICD-10-CM | POA: Diagnosis not present

## 2019-02-25 DIAGNOSIS — D485 Neoplasm of uncertain behavior of skin: Secondary | ICD-10-CM | POA: Diagnosis not present

## 2019-02-25 DIAGNOSIS — D225 Melanocytic nevi of trunk: Secondary | ICD-10-CM | POA: Diagnosis not present

## 2019-02-25 DIAGNOSIS — D1801 Hemangioma of skin and subcutaneous tissue: Secondary | ICD-10-CM | POA: Diagnosis not present

## 2019-02-25 DIAGNOSIS — D2261 Melanocytic nevi of right upper limb, including shoulder: Secondary | ICD-10-CM | POA: Diagnosis not present

## 2019-02-25 DIAGNOSIS — D2262 Melanocytic nevi of left upper limb, including shoulder: Secondary | ICD-10-CM | POA: Diagnosis not present

## 2019-08-08 DIAGNOSIS — Z0000 Encounter for general adult medical examination without abnormal findings: Secondary | ICD-10-CM | POA: Diagnosis not present

## 2019-08-18 DIAGNOSIS — D485 Neoplasm of uncertain behavior of skin: Secondary | ICD-10-CM | POA: Diagnosis not present

## 2019-08-19 DIAGNOSIS — Z1331 Encounter for screening for depression: Secondary | ICD-10-CM | POA: Diagnosis not present

## 2019-08-19 DIAGNOSIS — Z0000 Encounter for general adult medical examination without abnormal findings: Secondary | ICD-10-CM | POA: Diagnosis not present

## 2020-03-25 DIAGNOSIS — D2262 Melanocytic nevi of left upper limb, including shoulder: Secondary | ICD-10-CM | POA: Diagnosis not present

## 2020-03-25 DIAGNOSIS — D225 Melanocytic nevi of trunk: Secondary | ICD-10-CM | POA: Diagnosis not present

## 2020-03-25 DIAGNOSIS — D2261 Melanocytic nevi of right upper limb, including shoulder: Secondary | ICD-10-CM | POA: Diagnosis not present

## 2020-03-25 DIAGNOSIS — D1801 Hemangioma of skin and subcutaneous tissue: Secondary | ICD-10-CM | POA: Diagnosis not present

## 2020-04-17 DIAGNOSIS — Z23 Encounter for immunization: Secondary | ICD-10-CM | POA: Diagnosis not present

## 2020-07-11 DIAGNOSIS — Z03818 Encounter for observation for suspected exposure to other biological agents ruled out: Secondary | ICD-10-CM | POA: Diagnosis not present

## 2023-05-22 DIAGNOSIS — D485 Neoplasm of uncertain behavior of skin: Secondary | ICD-10-CM | POA: Diagnosis not present

## 2023-07-20 DIAGNOSIS — M79643 Pain in unspecified hand: Secondary | ICD-10-CM | POA: Diagnosis not present

## 2023-09-18 DIAGNOSIS — Z0000 Encounter for general adult medical examination without abnormal findings: Secondary | ICD-10-CM | POA: Diagnosis not present

## 2023-09-18 DIAGNOSIS — R7989 Other specified abnormal findings of blood chemistry: Secondary | ICD-10-CM | POA: Diagnosis not present

## 2023-09-25 DIAGNOSIS — Z1339 Encounter for screening examination for other mental health and behavioral disorders: Secondary | ICD-10-CM | POA: Diagnosis not present

## 2023-09-25 DIAGNOSIS — Z1331 Encounter for screening for depression: Secondary | ICD-10-CM | POA: Diagnosis not present

## 2023-09-25 DIAGNOSIS — R82998 Other abnormal findings in urine: Secondary | ICD-10-CM | POA: Diagnosis not present

## 2023-09-25 DIAGNOSIS — Z0000 Encounter for general adult medical examination without abnormal findings: Secondary | ICD-10-CM | POA: Diagnosis not present

## 2023-11-21 DIAGNOSIS — D2261 Melanocytic nevi of right upper limb, including shoulder: Secondary | ICD-10-CM | POA: Diagnosis not present

## 2023-11-21 DIAGNOSIS — L812 Freckles: Secondary | ICD-10-CM | POA: Diagnosis not present

## 2023-11-21 DIAGNOSIS — D225 Melanocytic nevi of trunk: Secondary | ICD-10-CM | POA: Diagnosis not present

## 2023-11-21 DIAGNOSIS — D2262 Melanocytic nevi of left upper limb, including shoulder: Secondary | ICD-10-CM | POA: Diagnosis not present
